# Patient Record
Sex: Male | Born: 2000 | Race: Black or African American | Hispanic: No | Marital: Single | Smoking: Never smoker
Health system: Southern US, Community
[De-identification: ages and names within clinical notes are randomized; demographics above are authoritative.]

## PROBLEM LIST (undated history)

## (undated) DIAGNOSIS — F840 Autistic disorder: Secondary | ICD-10-CM

## (undated) DIAGNOSIS — D571 Sickle-cell disease without crisis: Secondary | ICD-10-CM

## (undated) DIAGNOSIS — Q992 Fragile X chromosome: Secondary | ICD-10-CM

## (undated) HISTORY — PX: MOUTH RANULA EXCISION: SHX2049

## (undated) HISTORY — DX: Autistic disorder: F84.0

## (undated) HISTORY — DX: Fragile x chromosome: Q99.2

## (undated) HISTORY — DX: Sickle-cell disease without crisis: D57.1

---
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
## 2022-09-28 ENCOUNTER — Encounter: Payer: Self-pay | Admitting: Podiatry

## 2022-09-28 ENCOUNTER — Ambulatory Visit (INDEPENDENT_AMBULATORY_CARE_PROVIDER_SITE_OTHER): Payer: BC Managed Care – PPO | Admitting: Podiatry

## 2022-09-28 VITALS — BP 137/80 | HR 68 | Ht 64.0 in | Wt 130.0 lb

## 2022-09-28 DIAGNOSIS — L600 Ingrowing nail: Secondary | ICD-10-CM | POA: Diagnosis not present

## 2022-09-28 NOTE — Progress Notes (Signed)
Subjective:   Patient ID: Erik Barnett, male   DOB: 22 y.o.   MRN: [id number]   HPI Chief Complaint  Patient presents with   Toe Pain    Possible in grown toe nail, medication has made it worse. Not sure of injury Right big toe    22 year old male presents Today with his mom for the above concerns.  She previously seen by his pediatrician and was told it may be an ingrown toenail.  His mom has previously noticed Pus coming from the nail but looks good now.  They have been cleaning the area.  He was on Keflex but it made the symptoms worse so was discontinued.  Review of Systems  All other systems reviewed and are negative.  Past Medical History:  Diagnosis Date   Autism    Fragile X syndrome    Sickle cell anemia    Trait only!!    Past Surgical History:  Procedure Laterality Date   MOUTH RANULA EXCISION       Current Outpatient Medications:    FLUoxetine (PROZAC) 10 MG tablet, Take 10 mg by mouth daily., Disp: , Rfl:   No Known Allergies         Objective:  Physical Exam  General: NAD  Dermatological: Incurvation present on the hallux toenails and granulation tissue present.  There is minimal edema there is no erythema, ascending cellulitis.  No open lesions.  No fluctuation or crepitation.  No malodor.  Vascular: Dorsalis Pedis artery and Posterior Tibial artery pedal pulses are 2/4 bilateral with immedate capillary fill time.  There is no pain with calf compression, swelling, warmth, erythema.   Neruologic: Grossly intact via light touch bilateral.    Musculoskeletal: There is mild tenderness to palpation along the right hallux toenail mostly on the medial nail border.  Gait: Unassisted, Nonantalgic.       Assessment:   Chronic ingrown toenail right hallux     Plan:  We discussed partial nail avulsion versus conservative treatment.  This been ongoing for quite some time.  Trying to debride this today but he would not allow me to.  I discussed with his  mom partial nail avulsion with chemical matricectomy to both medial lateral nail borders and a discussion they wish to proceed with this.  We discussed risks of the procedure including infection, reoccurrence, dystrophy, nerve issues.  They wish to proceed.  Consent was signed.  Will plan on doing this at the surgical center on Wednesday.  Vivi Barrack DPM

## 2022-09-28 NOTE — Patient Instructions (Signed)
Soak Instructions    THE DAY AFTER THE PROCEDURE  Place 1/4 cup of epsom salts in a quart of warm tap water.  Submerge your foot or feet with outer bandage intact for the initial soak; this will allow the bandage to become moist and wet for easy lift off.  Once you remove your bandage, continue to soak in the solution for 20 minutes.  This soak should be done twice a day.  Next, remove your foot or feet from solution, blot dry the affected area and cover.  You may use a band aid large enough to cover the area or use gauze and tape.  Apply other medications to the area as directed by the doctor such as polysporin neosporin.  IF YOUR SKIN BECOMES IRRITATED WHILE USING THESE INSTRUCTIONS, IT IS OKAY TO SWITCH TO  WHITE VINEGAR AND WATER. Or you may use antibacterial soap and water to keep the toe clean  Monitor for any signs/symptoms of infection. Call the office immediately if any occur or go directly to the emergency room. Call with any questions/concerns.  Pre-Operative Instructions  Congratulations, you have decided to take an important step to improving your quality of life.  You can be assured that the doctors of [facility] will be with you every step of the way.  Plan to be at the surgery center/hospital at least 1 (one) hour prior to your scheduled time unless otherwise directed by the surgical center/hospital staff.  You must have a responsible adult accompany you, remain during the surgery and drive you home.  Make sure you have directions to the surgical center/hospital and know how to get there on time. For hospital based surgery you will need to obtain a history and physical form from your family physician within 1 month prior to the date of surgery- we will give you a form for you primary physician.  We make every effort to accommodate the date you request for surgery.  There are however, times where surgery dates or times have to be moved.  We will contact you as soon as possible  if a change in schedule is required.   No Aspirin/Ibuprofen for one week before surgery.  If you are on aspirin, any non-steroidal anti-inflammatory medications (Mobic, Aleve, Ibuprofen) you should stop taking it 7 days prior to your surgery.  You make take Tylenol  For pain prior to surgery.  Medications- If you are taking daily heart and blood pressure medications, seizure, reflux, allergy, asthma, anxiety, pain or diabetes medications, make sure the surgery center/hospital is aware before the day of surgery so they may notify you which medications to take or avoid the day of surgery. No food or drink after midnight the night before surgery unless directed otherwise by surgical center/hospital staff. No alcoholic beverages 24 hours prior to surgery.  No smoking 24 hours prior to or 24 hours after surgery. Wear loose pants or shorts- loose enough to fit over bandages, boots, and casts. No slip on shoes, sneakers are best. Bring your boot with you to the surgery center/hospital.  Also bring crutches or a walker if your physician has prescribed it for you.  If you do not have this equipment, it will be provided for you after surgery. If you have not been contracted by the surgery center/hospital by the day before your surgery, call to confirm the date and time of your surgery. Leave-time from work may vary depending on the type of surgery you have.  Appropriate arrangements should   be made prior to surgery with your employer. Prescriptions will be provided immediately following surgery by your doctor.  Have these filled as soon as possible after surgery and take the medication as directed. Remove nail polish on the operative foot. Wash the night before surgery.  The night before surgery wash the foot and leg well with the antibacterial soap provided and water paying special attention to beneath the toenails and in between the toes.  Rinse thoroughly with water and dry well with a towel.  Perform this wash  unless told not to do so by your physician.  Enclosed: 1 Ice pack (please put in freezer the night before surgery)   1 Hibiclens skin cleaner   Pre-op Instructions  If you have any questions regarding the instructions, do not hesitate to call our office at any point during this process.   [place]: [address] [place], [place] [address] [phone]  [place]: [address] [place], [place] [address] [phone]  Dr. Ashraf Mesta, DPM  

## 2022-09-29 ENCOUNTER — Telehealth: Payer: Self-pay | Admitting: Urology

## 2022-09-29 NOTE — Telephone Encounter (Signed)
DOS - 09/30/22  EXC NAIL PERM 1ST RIGHT --- 11750  [employer]    SPOKE WITH LORI WITH [employer] AND SHE STATED THAT FOR CPT CODE 24580 NO PRIOR AUTH IS REQUIRED.  CALL REF # [id number]

## 2022-09-30 ENCOUNTER — Encounter: Payer: Self-pay | Admitting: Podiatry

## 2022-09-30 ENCOUNTER — Other Ambulatory Visit: Payer: Self-pay | Admitting: Podiatry

## 2022-09-30 DIAGNOSIS — L600 Ingrowing nail: Secondary | ICD-10-CM | POA: Diagnosis not present

## 2022-09-30 MED ORDER — HYDROCODONE-ACETAMINOPHEN 5-325 MG PO TABS: 1.0000 | ORAL_TABLET | Freq: Four times a day (QID) | ORAL | 0 refills | Status: AC | PRN

## 2022-09-30 MED ORDER — CEPHALEXIN 500 MG PO CAPS: 500.0000 mg | ORAL_CAPSULE | Freq: Three times a day (TID) | ORAL | 0 refills | Status: AC

## 2022-09-30 NOTE — Progress Notes (Signed)
Postop medications sent 

## 2022-10-19 ENCOUNTER — Ambulatory Visit (INDEPENDENT_AMBULATORY_CARE_PROVIDER_SITE_OTHER): Payer: BC Managed Care – PPO | Admitting: Podiatry

## 2022-10-19 DIAGNOSIS — L600 Ingrowing nail: Secondary | ICD-10-CM

## 2022-10-19 NOTE — Patient Instructions (Signed)

## 2022-10-21 NOTE — Progress Notes (Signed)
Subjective: Chief Complaint  Patient presents with   Ingrown Toenail    Nail check   22 year old male presents the office today for further evaluation after undergoing partial nail avulsion of the right hallux.  Mom states has been doing well.  No drainage or pus.  He has not been seen to complain about the nail.  No other concerns today.  Objective: NAD DP/PT pulses palpable bilaterally, CRT less than 3 seconds There close partial avulsion right hallux and minimal scabbing is present.  There is no sign of edema there is no erythema or warmth.  No drainage or pus or any signs of infection.  No open lesions.  Not able to elicit any significant pain. No pain with calf compression, swelling, warmth, erythema  Assessment: Status post partial nail avulsion, healing well  Plan: -All treatment options discussed with the patient including all alternatives, risks, complications.  -She is to be healing well.  Continue Epsom salt soaks and washing with soap and water daily.  Antibiotic ointment and dressing during the day but can leave the area open at nighttime.  I would do this until scabs come off and the area is completely healed.  Monitor for any signs or symptoms of infection or recurrence. -Patient encouraged to call the office with any questions, concerns, change in symptoms.   Vivi Barrack DPM

## 2022-11-18 ENCOUNTER — Telehealth: Payer: Self-pay

## 2022-11-18 NOTE — Telephone Encounter (Signed)
Mom called to see if we had received her signed ROI that she sent to the [facility] email. Please advise. If you could please call mom with an update at [phone] or can try [phone]

## 2022-11-19 ENCOUNTER — Ambulatory Visit (INDEPENDENT_AMBULATORY_CARE_PROVIDER_SITE_OTHER): Payer: BC Managed Care – PPO | Admitting: Podiatry

## 2022-11-19 DIAGNOSIS — L600 Ingrowing nail: Secondary | ICD-10-CM | POA: Diagnosis not present

## 2022-11-19 MED ORDER — DIAZEPAM 5 MG PO TABS: ORAL_TABLET | ORAL | 0 refills | Status: AC

## 2022-11-19 MED ORDER — GENTAMICIN SULFATE 0.1 % EX OINT: 1.0000 | TOPICAL_OINTMENT | Freq: Three times a day (TID) | CUTANEOUS | 0 refills | Status: AC

## 2022-11-19 MED ORDER — SULFAMETHOXAZOLE-TRIMETHOPRIM 800-160 MG PO TABS: 1.0000 | ORAL_TABLET | Freq: Two times a day (BID) | ORAL | 0 refills | Status: AC

## 2022-11-19 NOTE — Progress Notes (Signed)
Subjective: Chief Complaint  Patient presents with   Ingrown Toenail   22 year old male presents with his mom for concerns of continued discomfort as well as some localized drainage on the right lateral nail border.  There is still soaking Epsom salts.  No recent injuries.  Objective: NAD DP/PT pulses palpable bilaterally, CRT less than 3 seconds On the proximal lateral nail fold of the right big toe there is.  Granulation tissue with minimal clear drainage expressed but there is no frank purulence.  There is no ascending cellulitis.  There is tenderness palpation provide I can tell today to this area but no pain in the other nail border. No pain with calf compression, swelling, warmth, erythema  Assessment: Ingrown toenail  Plan: -All treatment options discussed with the patient including all alternatives, risks, complications.  -We discussed try to clean the area.  I tried to attempt to anesthetize this today in the office not able to do so.  When I tried that she went.  I prescribed Valium to take prior to coming into the office to see this to help relax him in order to numb the toe.  For now recommend Epsom salt soaks and prescribed Bactrim.  Antibiotic ointment dressing changes daily and prescribed antibiotic ointment as well. -Monitor for any clinical signs or symptoms of infection and directed to call the office immediately should any occur or go to the ER.  No follow-ups on file.  Vivi Barrack DPM  -Patient encouraged to call the office with any questions, concerns, change in symptoms.

## 2022-11-19 NOTE — Patient Instructions (Signed)

## 2022-11-19 NOTE — Telephone Encounter (Signed)
Document received and placed on Dr Newman Pies was a patient in Dr Karilyn Cota practice in [place].

## 2022-11-23 ENCOUNTER — Ambulatory Visit (INDEPENDENT_AMBULATORY_CARE_PROVIDER_SITE_OTHER): Payer: BC Managed Care – PPO | Admitting: Podiatry

## 2022-11-23 DIAGNOSIS — L600 Ingrowing nail: Secondary | ICD-10-CM | POA: Diagnosis not present

## 2022-11-23 NOTE — Patient Instructions (Signed)

## 2022-11-23 NOTE — Progress Notes (Signed)
Subjective: Chief Complaint  Patient presents with   Ingrown Toenail    21 year old male presents with his mom for follow-up evaluation of ingrown toenail.  States that since starting antibiotic and soaking his been doing better.  No drainage or pus.  No red streaks.  No fevers or chills.  No other concerns.  Objective: NAD DP/PT pulses palpable bilaterally, CRT less than 3 seconds On the proximal lateral nail fold of the right big toe there is still some localized granulation tissue slight localized edema but there is no erythema, drainage or pus or signs of infection.  Overall doing much better.  He is allowing me to touch it today which I was not able to do much last visit. No pain with calf compression, swelling, warmth, erythema  Assessment: Ingrown toenail  Plan: -All treatment options discussed with the patient including all alternatives, risks, complications.  -We discussed partial nail avulsion but seems to be healing.  His mom and I both decided to continue soaking episodes, finish course of antibiotics.  Continue antibiotic ointment dressing changes on exam with the area open at night.  Monitor any signs or symptoms of infection.  If no improvement over the next 1 to 2 weeks to let me know or sooner if there is any worsening.  Return if symptoms worsen or fail to improve.  Vivi Barrack DPM
